# Patient Record
Sex: Male | Born: 1990 | Race: Black or African American | Hispanic: No | Marital: Single | Smoking: Never smoker
Health system: Southern US, Community
[De-identification: ages and names within clinical notes are randomized; demographics above are authoritative.]

---
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
## 2019-09-01 ENCOUNTER — Ambulatory Visit: Payer: Self-pay | Attending: Internal Medicine

## 2019-09-01 DIAGNOSIS — Z23 Encounter for immunization: Secondary | ICD-10-CM

## 2019-09-01 NOTE — Progress Notes (Signed)
   Covid-19 Vaccination Clinic  Name:  SARATH PRIVOTT    MRN: [id number] DOB: 1991/01/20  09/01/2019  Mr. Postlewait was observed post Covid-19 immunization for 15 minutes without incident. He was provided with Vaccine Information Sheet and instruction to access the V-Safe system.   Mr. Maslowski was instructed to call 911 with any severe reactions post vaccine: Marland Kitchen Difficulty breathing  . Swelling of face and throat  . A fast heartbeat  . A bad rash all over body  . Dizziness and weakness   Immunizations Administered    Name Date Dose VIS Date Route   Pfizer COVID-19 Vaccine 09/01/2019  8:17 AM 0.3 mL 05/18/2018 Intramuscular   Manufacturer: [employer], [employer]   Lot: [id number]   NDC: [address]-5

## 2019-09-22 ENCOUNTER — Ambulatory Visit: Payer: Self-pay | Attending: Internal Medicine

## 2019-09-22 DIAGNOSIS — Z23 Encounter for immunization: Secondary | ICD-10-CM

## 2019-09-22 NOTE — Progress Notes (Signed)
   Covid-19 Vaccination Clinic  Name:  Cody Odonnell    MRN: [id number] DOB: 1991-02-01  09/22/2019  Mr. [name] was observed post Covid-19 immunization for 15 minutes without incident. He was provided with Vaccine Information Sheet and instruction to access the V-Safe system.   Mr. [name] was instructed to call 911 with any severe reactions post vaccine: Marland Kitchen Difficulty breathing  . Swelling of face and throat  . A fast heartbeat  . A bad rash all over body  . Dizziness and weakness   Immunizations Administered    Name Date Dose VIS Date Route   Pfizer COVID-19 Vaccine 09/22/2019  8:08 AM 0.3 mL 05/18/2018 Intramuscular   Manufacturer: [employer], [employer]   Lot: [id number]   NDC: [address]-2

## 2020-08-14 ENCOUNTER — Emergency Department (HOSPITAL_COMMUNITY)
Admission: EM | Admit: 2020-08-14 | Discharge: 2020-08-14 | Disposition: A | Discharge status: Home / Self Care | Payer: Self-pay | Attending: Emergency Medicine | Admitting: Emergency Medicine

## 2020-08-14 ENCOUNTER — Emergency Department (HOSPITAL_COMMUNITY): Payer: Self-pay

## 2020-08-14 ENCOUNTER — Other Ambulatory Visit: Payer: Self-pay

## 2020-08-14 DIAGNOSIS — S82891A Other fracture of right lower leg, initial encounter for closed fracture: Secondary | ICD-10-CM

## 2020-08-14 DIAGNOSIS — W51XXXA Accidental striking against or bumped into by another person, initial encounter: Secondary | ICD-10-CM | POA: Insufficient documentation

## 2020-08-14 DIAGNOSIS — S82301A Unspecified fracture of lower end of right tibia, initial encounter for closed fracture: Secondary | ICD-10-CM | POA: Insufficient documentation

## 2020-08-14 DIAGNOSIS — Y9367 Activity, basketball: Secondary | ICD-10-CM | POA: Insufficient documentation

## 2020-08-14 MED ORDER — IBUPROFEN 400 MG PO TABS
600.0000 mg | ORAL_TABLET | Freq: Once | ORAL | Status: AC
  Administered 2020-08-14: 600 mg via ORAL
  Filled 2020-08-14: qty 1

## 2020-08-14 NOTE — ED Triage Notes (Signed)
Pt was playing basketball and someone fell on his right ankle.

## 2020-08-14 NOTE — ED Provider Notes (Signed)
[facility] EMERGENCY DEPARTMENT Provider Note   CSN: [id number] Arrival date & time: 08/14/20  9509     History Chief Complaint  Patient presents with  . Ankle Pain    Cody Odonnell is a 30 y.o. male.  Ankle pain after playing basketball.  Pain and swelling at the lateral malleoli of the right foot.  Has tried no medications.  Bearing weight is worse.  Resting is better.  Patient is elevating it.  No prior injury to the area.  No other injuries reported.  No open wounds.  No numbness tingling no weakness        No past medical history on file.  There are no problems to display for this patient.        No family history on file.     Home Medications Prior to Admission medications   Not on File    Allergies    Patient has no allergy information on record.  Review of Systems   Review of Systems  Constitutional: Negative for chills and fever.  HENT: Negative for congestion and rhinorrhea.   Respiratory: Negative for cough and shortness of breath.   Cardiovascular: Negative for chest pain and palpitations.  Gastrointestinal: Negative for diarrhea, nausea and vomiting.  Genitourinary: Negative for difficulty urinating and dysuria.  Musculoskeletal: Positive for arthralgias and joint swelling. Negative for back pain.  Skin: Negative for color change and rash.  Neurological: Negative for light-headedness and headaches.    Physical Exam Updated Vital Signs Ht 6\' 4"  (1.93 m)   Wt 97.5 kg   BMI 26.17 kg/m   Physical Exam Vitals and nursing note reviewed.  Constitutional:      General: Cody Odonnell is not in acute distress.    Appearance: Normal appearance.  HENT:     Head: Normocephalic and atraumatic.     Nose: No rhinorrhea.  Eyes:     General:        Right eye: No discharge.        Left eye: No discharge.     Conjunctiva/sclera: Conjunctivae normal.  Cardiovascular:     Rate and Rhythm: Normal rate and regular rhythm.  Pulmonary:      Effort: Pulmonary effort is normal.     Breath sounds: No stridor.  Abdominal:     General: Abdomen is flat. There is no distension.     Palpations: Abdomen is soft.  Musculoskeletal:        General: Swelling and tenderness present. No deformity or signs of injury.     Comments: Tenderness of the lateral malleoli, decreased range of motion due to pain.  Neurovascular intact distal.  No bony tenderness elsewhere.  Mild swelling about the lateral malleoli  Skin:    General: Skin is warm and dry.  Neurological:     General: No focal deficit present.     Mental Status: Cody Odonnell is alert. Mental status is at baseline.     Motor: No weakness.  Psychiatric:        Mood and Affect: Mood normal.        Behavior: Behavior normal.        Thought Content: Thought content normal.     ED Results / Procedures / Treatments   Labs (all labs ordered are listed, but only abnormal results are displayed) Labs Reviewed - No data to display  EKG None  Radiology DG Ankle Complete Right  Result Date: 08/14/2020 CLINICAL DATA:  Swelling. EXAM: RIGHT ANKLE - COMPLETE 3+  VIEW COMPARISON:  11/30/2000. FINDINGS: Soft tissue swelling noted over the lateral malleolus. Bony density noted along the anterior aspect of the distal tibia consistent small fracture fragment. Lateral malleolus appears to be intact. No radiopaque foreign body. IMPRESSION: 1. Tiny bony density noted along the anterior aspect of the distal tibia. This most likely represents a small fracture fragment. 2. Soft tissue swelling noted over the lateral malleolus. Lateral malleolus appears to be intact. Electronically Signed   By: Maisie Fus  Register   On: 08/14/2020 09:15    Procedures Procedures   Medications Ordered in ED Medications  ibuprofen (ADVIL) tablet 600 mg (600 mg Oral Given 08/14/20 0177)    ED Course  I have reviewed the triage vital signs and the nursing notes.  Pertinent labs & imaging results that were available during my  care of the patient were reviewed by me and considered in my medical decision making (see chart for details).    MDM Rules/Calculators/A&P                          Likely right ankle inversion injury resulting in sprain however will rule out bony injury with x-ray.  Ibuprofen given.  Leg is elevated.  X-ray shows a small fragment just anterior to the tibia on the lateral view, possible fracture.  We will put in a walking boot will give crutches for weightbearing as tolerated will have follow-up with outpatient orthopedics.  After my review of the x-ray imaging radiologist no other findings.  Cody Odonnell is safe for discharge home return precautions discussed Final Clinical Impression(s) / ED Diagnoses Final diagnoses:  Closed fracture of right ankle, initial encounter    Rx / DC Orders ED Discharge Orders    None       Sabino Donovan, MD 08/14/20 1001

## 2020-08-14 NOTE — ED Notes (Signed)
Pt returned from xray

## 2020-08-14 NOTE — ED Notes (Signed)
Called Ortho to place CAM boot and crutches.

## 2020-08-14 NOTE — ED Notes (Signed)
Patient transported to X-ray 

## 2020-08-14 NOTE — Discharge Instructions (Addendum)
Weightbearing as tolerated with the fracture boot and crutches.  Tylenol and ibuprofen as prescribed below.  Follow-up with the orthopedist in approximately 1 week.  You can take 600 mg of ibuprofen every 6 hours, you can take 1000 mg of Tylenol every 6 hours, you can alternate these every 3 or you can take them together.

## 2022-07-18 ENCOUNTER — Other Ambulatory Visit (HOSPITAL_BASED_OUTPATIENT_CLINIC_OR_DEPARTMENT_OTHER): Payer: Self-pay

## 2022-07-18 ENCOUNTER — Other Ambulatory Visit: Payer: Self-pay

## 2022-07-18 ENCOUNTER — Emergency Department (HOSPITAL_BASED_OUTPATIENT_CLINIC_OR_DEPARTMENT_OTHER)
Admission: EM | Admit: 2022-07-18 | Discharge: 2022-07-18 | Disposition: A | Discharge status: Home / Self Care | Payer: Self-pay | Attending: Emergency Medicine | Admitting: Emergency Medicine

## 2022-07-18 ENCOUNTER — Encounter (HOSPITAL_BASED_OUTPATIENT_CLINIC_OR_DEPARTMENT_OTHER): Payer: Self-pay | Admitting: Emergency Medicine

## 2022-07-18 DIAGNOSIS — M62838 Other muscle spasm: Secondary | ICD-10-CM | POA: Insufficient documentation

## 2022-07-18 DIAGNOSIS — M940 Chondrocostal junction syndrome [Tietze]: Secondary | ICD-10-CM | POA: Insufficient documentation

## 2022-07-18 MED ORDER — ACETAMINOPHEN 500 MG PO TABS
500.0000 mg | ORAL_TABLET | Freq: Four times a day (QID) | ORAL | 0 refills | Status: AC | PRN
  Filled 2022-07-18: qty 30, 8d supply, fill #0

## 2022-07-18 MED ORDER — NAPROXEN 250 MG PO TABS
500.0000 mg | ORAL_TABLET | Freq: Once | ORAL | Status: AC
  Administered 2022-07-18: 500 mg via ORAL
  Filled 2022-07-18: qty 2

## 2022-07-18 MED ORDER — IBUPROFEN 600 MG PO TABS
600.0000 mg | ORAL_TABLET | Freq: Four times a day (QID) | ORAL | 0 refills | Status: AC
  Filled 2022-07-18: qty 20, 5d supply, fill #0

## 2022-07-18 MED ORDER — DIAZEPAM 5 MG PO TABS
5.0000 mg | ORAL_TABLET | Freq: Once | ORAL | Status: AC
  Administered 2022-07-18: 5 mg via ORAL
  Filled 2022-07-18: qty 1

## 2022-07-18 MED ORDER — METHOCARBAMOL 500 MG PO TABS
500.0000 mg | ORAL_TABLET | Freq: Two times a day (BID) | ORAL | 0 refills | Status: AC
  Filled 2022-07-18: qty 20, 10d supply, fill #0

## 2022-07-18 MED ORDER — MELOXICAM 7.5 MG PO TABS: 7.5000 mg | ORAL_TABLET | Freq: Every day | ORAL | 0 refills | Status: AC

## 2022-07-18 MED ORDER — OXYCODONE-ACETAMINOPHEN 5-325 MG PO TABS
1.0000 | ORAL_TABLET | Freq: Once | ORAL | Status: AC
  Administered 2022-07-18: 1 via ORAL
  Filled 2022-07-18: qty 1

## 2022-07-18 NOTE — ED Provider Notes (Signed)
EMERGENCY DEPARTMENT AT [facility] Provider Note   CSN: [id number] Arrival date & time: 07/18/22  4098     History  Chief Complaint  Patient presents with   Flank Pain    Cody Odonnell is a 32 y.o. male.  HPI    32 year old male comes in with chief complaint of flank pain.  Patient states that 3 days ago he was playing basketball, when he started to experience seeing pain over the right flank region.  Patient recalls trying to do the other person and in the process strained his muscles, he instantly had severe pain and went to the floor.  Ever since then he continues to have discomfort with certain movements.  Pain is primarily located over the right lateral chest.  Patient does state that he heard a pop when this incident occurred.  He has been taking as needed Tylenol and Motrin with slight relief.  Home Medications Prior to Admission medications   Medication Sig Start Date End Date Taking? Authorizing Provider  acetaminophen (TYLENOL) 500 MG tablet Take 1 tablet (500 mg total) by mouth every 6 (six) hours as needed. 07/18/22  Yes Derwood Kaplan, MD  ibuprofen (ADVIL) 600 MG tablet Take 1 tablet (600 mg total) by mouth 4 (four) times daily. 07/18/22  Yes Derwood Kaplan, MD  meloxicam (MOBIC) 7.5 MG tablet Take 1 tablet (7.5 mg total) by mouth daily for 7 days. 07/18/22 07/25/22 Yes Derwood Kaplan, MD  methocarbamol (ROBAXIN) 500 MG tablet Take 1 tablet (500 mg total) by mouth 2 (two) times daily. 07/18/22  Yes Derwood Kaplan, MD      Allergies    Patient has no allergy information on record.    Review of Systems   Review of Systems  All other systems reviewed and are negative.   Physical Exam Updated Vital Signs BP 136/73   Pulse (!) 58   Temp 97.9 F (36.6 C) (Oral)   Resp 18   Ht 6\' 5"  (1.956 m)   Wt 97.5 kg   SpO2 100%   BMI 25.50 kg/m  Physical Exam Vitals and nursing note reviewed.  Constitutional:      Appearance: He is  well-developed.  HENT:     Head: Atraumatic.  Cardiovascular:     Rate and Rhythm: Normal rate.  Pulmonary:     Effort: Pulmonary effort is normal.  Musculoskeletal:     Cervical back: Neck supple.     Comments: Patient has chest wall tenderness over the lower anterolateral right thoracic region, overlying the ribs.  No abdominal tenderness  Skin:    General: Skin is warm.  Neurological:     Mental Status: He is alert and oriented to person, place, and time.     ED Results / Procedures / Treatments   Labs (all labs ordered are listed, but only abnormal results are displayed) Labs Reviewed - No data to display  EKG None  Radiology No results found.  Procedures Procedures    Medications Ordered in ED Medications  oxyCODONE-acetaminophen (PERCOCET/ROXICET) 5-325 MG per tablet 1 tablet (1 tablet Oral Given 07/18/22 0909)  naproxen (NAPROSYN) tablet 500 mg (500 mg Oral Given 07/18/22 0909)  diazepam (VALIUM) tablet 5 mg (5 mg Oral Given 07/18/22 0909)    ED Course/ Medical Decision Making/ A&P                             Medical Decision Making Risk OTC drugs.  Prescription drug management.   32 year old patient comes in with chief complaint of flank pain, symptoms started while he was playing basketball.  Pain is not pleuritic.  Pain is worse with activity.  Differential diagnosis considered for this patient includes rib fracture, costochondritis, muscle strain, pneumothorax.  The patient is O2 sats are normal, there is no pleurisy and there was no blunt trauma involved.  No indication for x-ray.  He has reproducible tenderness with palpation, and I suspect that he likely has a muscle strain.  We will treat with RICE.  Mobic prescribed, he will use it only if he is not better with ibuprofen.  Advised that he take PPI with it.  Also will add muscle relaxants to his regular regimen.  If symptoms not better despite taking the medications, then he will call sports medicine  doctor.  Final Clinical Impression(s) / ED Diagnoses Final diagnoses:  Muscle spasticity  Costochondritis    Rx / DC Orders ED Discharge Orders          Ordered    methocarbamol (ROBAXIN) 500 MG tablet  2 times daily        07/18/22 0911    ibuprofen (ADVIL) 600 MG tablet  4 times daily        07/18/22 0911    acetaminophen (TYLENOL) 500 MG tablet  Every 6 hours PRN        07/18/22 0911    meloxicam (MOBIC) 7.5 MG tablet  Daily        07/18/22 0918              Derwood Kaplan, MD 07/18/22 1106

## 2022-07-18 NOTE — Discharge Instructions (Signed)
Take ibuprofen 600 mg every 6 hours for the next 5 days. Take Tylenol in addition to ibuprofen or in between ibuprofen for additional relief. Muscle relaxants can additionally help with your symptoms. We also recommend that you ice the area of injury 3-4 times a day for 15 to 20 minutes.  Take Mobic with an antacid if ibuprofen does not work.  If your symptoms are not better despite all these treatments, then call the doctor at the number provided for additional help.

## 2022-07-18 NOTE — ED Notes (Signed)
Pt given discharge instructions and reviewed prescriptions. Opportunities given for questions. Pt verbalizes understanding. Lacresia Darwish R, RN 

## 2022-07-18 NOTE — ED Triage Notes (Signed)
Pt arrives pov, steady gait, with c/o RT flank pain after playing basketball x 3 days pta. Pt reports feeling "a pop". Reports pain worse with movement, denies shob or CP

## 2022-07-25 ENCOUNTER — Other Ambulatory Visit (HOSPITAL_BASED_OUTPATIENT_CLINIC_OR_DEPARTMENT_OTHER): Payer: Self-pay

## 2022-08-05 IMAGING — CR DG ANKLE COMPLETE 3+V*R*
3 series · 3 of 3 positions shown · non-contrast
Comparison: 11/30/2000.

CLINICAL DATA: Swelling.

EXAM:
RIGHT ANKLE - COMPLETE 3+ VIEW

[ankle ap]
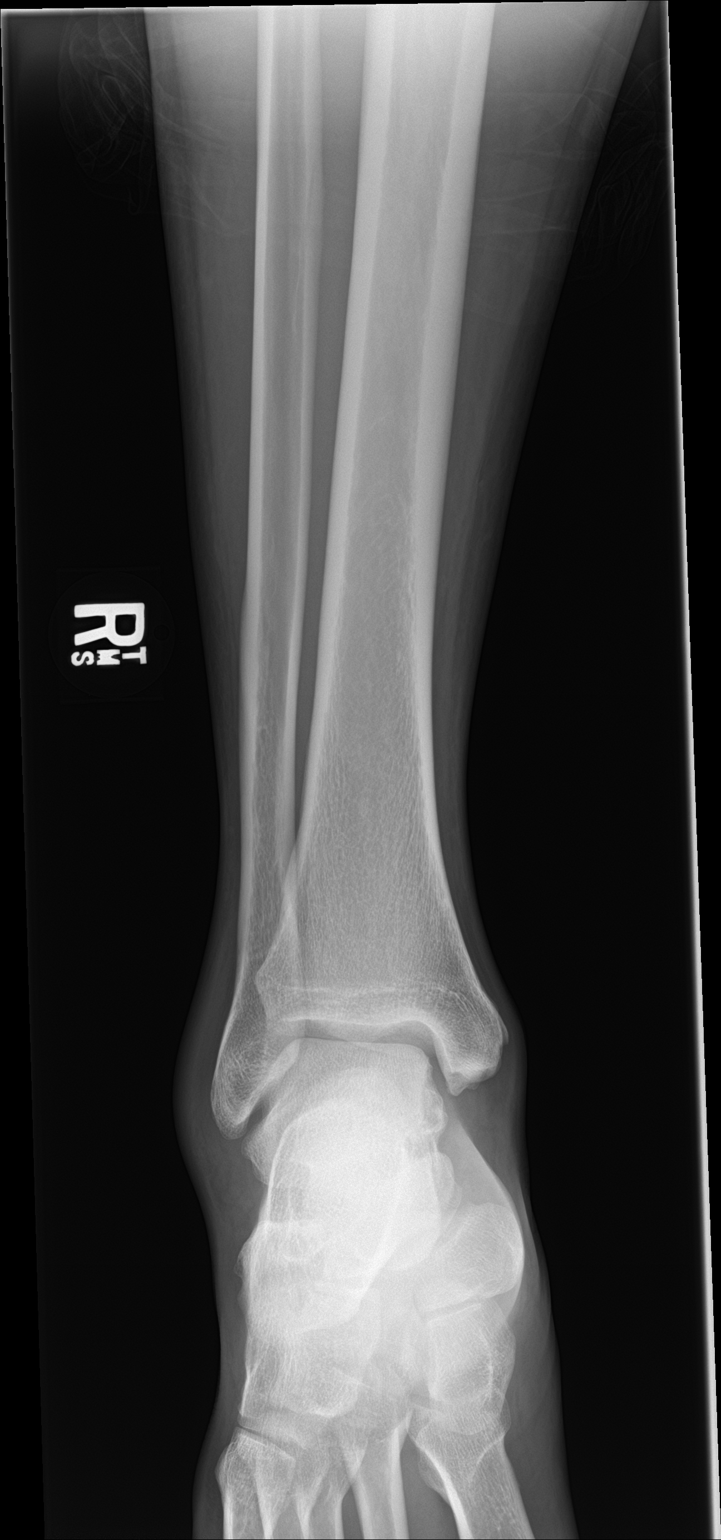

[ankle obl]
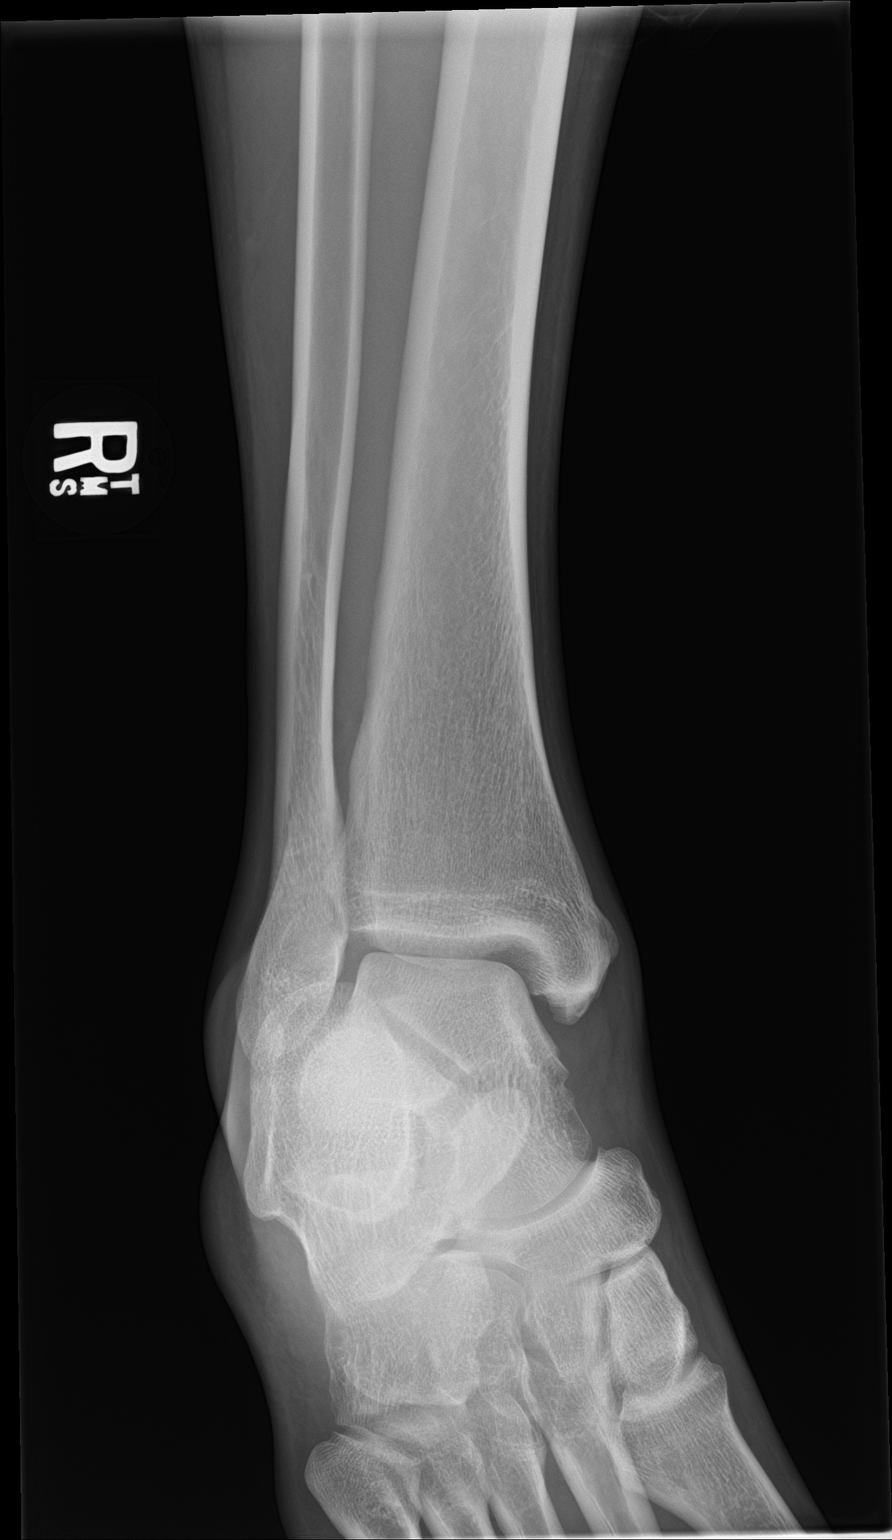

[ankle lat]
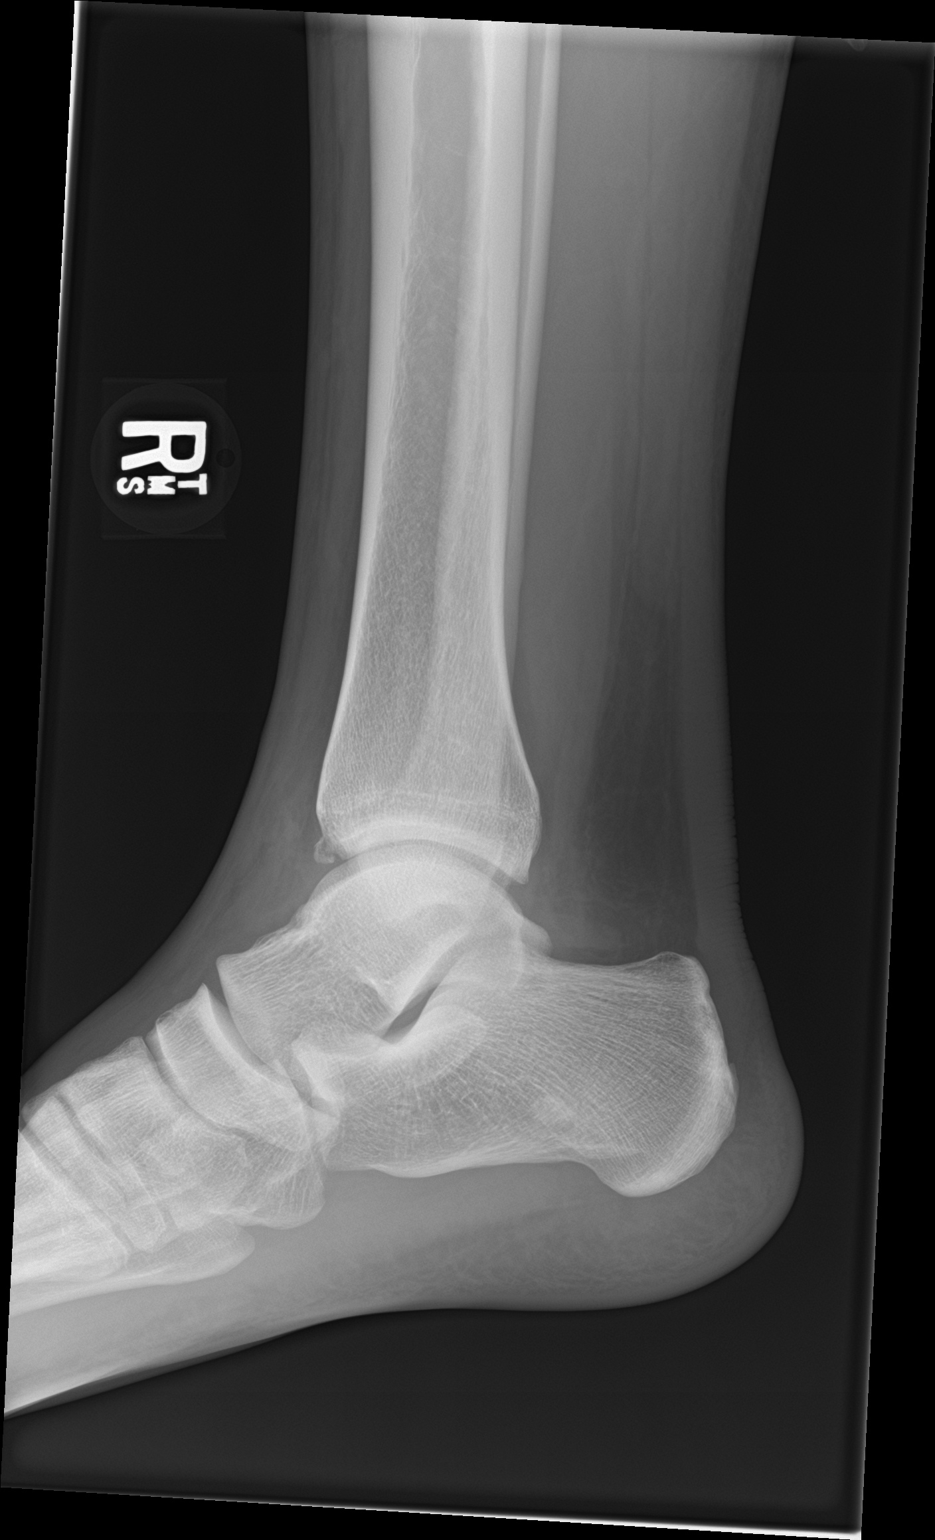

[3 of 3 positions shown; findings below may reference images not displayed]

FINDINGS: Soft tissue swelling noted over the lateral malleolus. Bony density
noted along the anterior aspect of the distal tibia consistent small
fracture fragment. Lateral malleolus appears to be intact. No
radiopaque foreign body.
IMPRESSION: 1. Tiny bony density noted along the anterior aspect of the distal
tibia. This most likely represents a small fracture fragment.

2. Soft tissue swelling noted over the lateral malleolus. Lateral
malleolus appears to be intact.

## 2022-09-23 ENCOUNTER — Other Ambulatory Visit: Payer: Self-pay

## 2022-09-23 ENCOUNTER — Emergency Department (HOSPITAL_BASED_OUTPATIENT_CLINIC_OR_DEPARTMENT_OTHER)
Admission: EM | Admit: 2022-09-23 | Discharge: 2022-09-23 | Disposition: A | Discharge status: Home / Self Care | Payer: Medicaid Other | Attending: Emergency Medicine | Admitting: Emergency Medicine

## 2022-09-23 ENCOUNTER — Encounter (HOSPITAL_BASED_OUTPATIENT_CLINIC_OR_DEPARTMENT_OTHER): Payer: Self-pay

## 2022-09-23 DIAGNOSIS — W57XXXA Bitten or stung by nonvenomous insect and other nonvenomous arthropods, initial encounter: Secondary | ICD-10-CM | POA: Insufficient documentation

## 2022-09-23 DIAGNOSIS — S90561A Insect bite (nonvenomous), right ankle, initial encounter: Secondary | ICD-10-CM | POA: Insufficient documentation

## 2022-09-23 MED ORDER — DIPHENHYDRAMINE HCL 25 MG PO CAPS
50.0000 mg | ORAL_CAPSULE | Freq: Once | ORAL | Status: AC
  Administered 2022-09-23: 50 mg via ORAL
  Filled 2022-09-23: qty 2

## 2022-09-23 NOTE — ED Triage Notes (Signed)
Patient here POV from Home.  Endorses Mild Swelling to Right Ankle Sunday. Worsened and reddened since. Unsure but believes it was an insect bite. No Known Fevers. No Drainage. Not painful.   NAD Noted during Triage. A&Ox4. GCS 15. Ambulatory.

## 2022-09-23 NOTE — ED Provider Notes (Signed)
  [place] EMERGENCY DEPARTMENT AT [facility] Provider Note   CSN: [id number] Arrival date & time: 09/23/22  1719     History Chief Complaint  Patient presents with   Edema    HPI JOVONNI LAFOUNTAINE is an otherwise healthy 32 y.o. male presenting for a swollen ankle. He noticed a mosquito bite yesterday and it was itchy. It has been swelling since. He has no pain, just itchiness, and able to ambulate. Has not tried anything at home.   Patient's recorded medical, surgical, social, medication list and allergies were reviewed in the Snapshot window as part of the initial history.   Review of Systems   Review of Systems  Constitutional:  Negative for chills and fever.  Eyes:  Negative for visual disturbance.  Respiratory:  Negative for chest tightness, shortness of breath, wheezing and stridor.   Skin:  Positive for color change.    Physical Exam Updated Vital Signs BP (!) 145/83 (BP Location: Right Arm)   Pulse 73   Temp 98.5 F (36.9 C) (Oral)   Resp 17   Ht 6\' 5"  (1.956 m)   Wt 95.3 kg   SpO2 97%   BMI 24.90 kg/m  Physical Exam Skin:    General: Skin is warm.     Comments: Edematous erythematous right lateral ankle with an excoriation and minimal oozing of serous fluid.       ED Course/ Medical Decision Making/ A&P Clinical Course as of 09/23/22 2035  Tue Sep 23, 2022  2004 Stable 14 YOM with a chief complaint of right ankle pain and swelling. Bug bite locally he suspects. Erethymatous/Vesicular. PO antihistamine. [CC]    Clinical Course User Index [CC] Glyn Ade, MD   Medications Ordered in ED Medications  diphenhydrAMINE (BENADRYL) capsule 50 mg (50 mg Oral Given 09/23/22 2016)    Medical Decision Making:    NIXSON BUTTA is a 32 y.o. male who presented to the ED today with a swollen ankle after a suspected bug bite. It it itchy, non painful and able to ambulate. Denies systemic symptoms of anaphylaxis, fevers or chills.     Reviewed and confirmed nursing documentation for past medical history, family history, social history.    Initial Assessment:   With the patient's presentation of allergic reaction secondary to most likely an anthropod bite is the, most likely diagnosis. Other diagnoses were considered including (but not limited to) cellulitis, broken ankle. These are considered less likely due to history of present illness and physical exam findings.    This is most consistent with an acute complicated illness  Initial Plan:   Given local symptoms he does not require any further evaluation.  Final Assessment and Plan:    #anthropod bite #allergic reaction  -50mg  bendaryl today -apply topical benadryl cream BID until symptoms resolve    Clinical Impression:  1. Arthropod bite, initial encounter      Discharge   Final Clinical Impression(s) / ED Diagnoses Final diagnoses:  Arthropod bite, initial encounter    Rx / DC Orders ED Discharge Orders     None         Hassan Rowan, [place], MD 09/23/22 2039    Glyn Ade, MD 09/23/22 2151

## 2022-09-23 NOTE — Discharge Instructions (Addendum)
Apply benadryl cream twice daily until symptoms resolve. Should you develop pain, oozing with pus, inability to ambulate, fever, chills, please return for further evaluation.
